# Patient Record
Sex: Male | Born: 1985 | Race: Black or African American | Hispanic: No | Marital: Single
Health system: Southern US, Community
[De-identification: ages and names within clinical notes are randomized; demographics above are authoritative.]

---
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
## 2009-12-10 ENCOUNTER — Emergency Department (HOSPITAL_COMMUNITY): Admission: EM | Admit: 2009-12-10 | Discharge: 2009-12-10 | Payer: Self-pay | Admitting: Emergency Medicine

## 2010-05-06 ENCOUNTER — Emergency Department (HOSPITAL_COMMUNITY): Admission: EM | Admit: 2010-05-06 | Discharge: 2010-05-06 | Payer: Self-pay | Admitting: Emergency Medicine

## 2010-09-23 LAB — BASIC METABOLIC PANEL
Chloride: 103 mEq/L (ref 96–112)
Creatinine, Ser: 0.96 mg/dL (ref 0.4–1.5)
GFR calc Af Amer: >60 mL/min (ref >60)
GFR calc non Af Amer: >60 mL/min (ref >60)
Potassium: 3.7 mEq/L (ref 3.5–5.1)

## 2010-09-23 LAB — CBC
MCV: 79.1 fL (ref 78.0–100.0)
RBC: 5.98 MIL/uL — ABNORMAL HIGH (ref 4.22–5.81)
WBC: 4.4 10*3/uL (ref 4.0–10.5)

## 2010-09-23 LAB — DIFFERENTIAL
Eosinophils Relative: 1 % (ref 0–5)
Lymphocytes Relative: 36 % (ref 12–46)
Lymphs Abs: 1.6 10*3/uL (ref 0.7–4.0)
Monocytes Relative: 13 % — ABNORMAL HIGH (ref 3–12)
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
## 2012-06-28 ENCOUNTER — Encounter (HOSPITAL_COMMUNITY): Payer: Self-pay | Admitting: Emergency Medicine

## 2012-06-28 ENCOUNTER — Emergency Department (HOSPITAL_COMMUNITY)
Admission: EM | Admit: 2012-06-28 | Discharge: 2012-07-07 | Disposition: E | Payer: Self-pay | Attending: Emergency Medicine | Admitting: Emergency Medicine

## 2012-06-28 DIAGNOSIS — Y33XXXA Other specified events, undetermined intent, initial encounter: Secondary | ICD-10-CM | POA: Insufficient documentation

## 2012-06-28 DIAGNOSIS — T794XXA Traumatic shock, initial encounter: Secondary | ICD-10-CM | POA: Insufficient documentation

## 2012-06-28 DIAGNOSIS — I469 Cardiac arrest, cause unspecified: Secondary | ICD-10-CM | POA: Insufficient documentation

## 2012-06-28 DIAGNOSIS — Y929 Unspecified place or not applicable: Secondary | ICD-10-CM | POA: Insufficient documentation

## 2012-06-28 DIAGNOSIS — Y939 Activity, unspecified: Secondary | ICD-10-CM | POA: Insufficient documentation

## 2012-06-29 LAB — TYPE AND SCREEN: Unit division: 0
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
## 2012-07-07 NOTE — ED Notes (Signed)
[facility] called.  Referral number [id number]

## 2012-07-07 NOTE — Code Documentation (Signed)
Hold CPR - asystole. Continue CPR. 

## 2012-07-07 NOTE — Code Documentation (Signed)
Hold CPR - Rhythm asystole. Continue CPR.
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
## 2012-07-07 NOTE — Code Documentation (Signed)
O negative blood pressure infused.

## 2012-07-07 NOTE — Code Documentation (Signed)
Patient time of death occurred at 0301. 

## 2012-07-07 NOTE — Code Documentation (Signed)
Hold CPR - asystole. Continue CPR.
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
## 2012-07-07 DEATH — deceased

## 2013-12-05 DEATH — deceased
